# Patient Record
Sex: Male | Born: 1975 | Race: Black or African American | Hispanic: No | Marital: Single | Smoking: Current every day smoker
Health system: Southern US, Community
[De-identification: ages and names within clinical notes are randomized; demographics above are authoritative.]

---

## 2016-04-23 ENCOUNTER — Emergency Department
Admission: EM | Admit: 2016-04-23 | Discharge: 2016-04-23 | Disposition: A | Discharge status: Home / Self Care | Payer: Self-pay | Attending: Emergency Medicine | Admitting: Emergency Medicine

## 2016-04-23 ENCOUNTER — Encounter: Payer: Self-pay | Admitting: *Deleted

## 2016-04-23 ENCOUNTER — Emergency Department: Payer: Self-pay

## 2016-04-23 DIAGNOSIS — R079 Chest pain, unspecified: Secondary | ICD-10-CM | POA: Insufficient documentation

## 2016-04-23 DIAGNOSIS — R51 Headache: Secondary | ICD-10-CM | POA: Insufficient documentation

## 2016-04-23 DIAGNOSIS — F17210 Nicotine dependence, cigarettes, uncomplicated: Secondary | ICD-10-CM | POA: Insufficient documentation

## 2016-04-23 LAB — CBC
HEMATOCRIT: 41.4 % (ref 40.0–52.0)
Hemoglobin: 14.2 g/dL (ref 13.0–18.0)
MCH: 31.4 pg (ref 26.0–34.0)
MCHC: 34.3 g/dL (ref 32.0–36.0)
MCV: 91.5 fL (ref 80.0–100.0)
Platelets: 228 10*3/uL (ref 150–440)
RBC: 4.52 MIL/uL (ref 4.40–5.90)
RDW: 13.6 % (ref 11.5–14.5)
WBC: 6.1 10*3/uL (ref 3.8–10.6)

## 2016-04-23 LAB — BASIC METABOLIC PANEL
Anion gap: 9 (ref 5–15)
BUN: 13 mg/dL (ref 6–20)
CALCIUM: 9.2 mg/dL (ref 8.9–10.3)
CO2: 26 mmol/L (ref 22–32)
Chloride: 103 mmol/L (ref 101–111)
Creatinine, Ser: 1.09 mg/dL (ref 0.61–1.24)
GFR calc Af Amer: >60 mL/min (ref >60)
GLUCOSE: 84 mg/dL (ref 65–99)
Potassium: 3.7 mmol/L (ref 3.5–5.1)
Sodium: 138 mmol/L (ref 135–145)

## 2016-04-23 LAB — FIBRIN DERIVATIVES D-DIMER (ARMC ONLY): FIBRIN DERIVATIVES D-DIMER (ARMC): 226 (ref 0–499)

## 2016-04-23 LAB — TROPONIN I
Troponin I: <0.03 ng/mL (ref <0.03)
Troponin I: <0.03 ng/mL (ref <0.03)

## 2016-04-23 MED ORDER — ACETAMINOPHEN 325 MG PO TABS
ORAL_TABLET | ORAL | Status: AC
  Filled 2016-04-23: qty 2

## 2016-04-23 MED ORDER — ACETAMINOPHEN 325 MG PO TABS
650.0000 mg | ORAL_TABLET | Freq: Once | ORAL | Status: AC
  Administered 2016-04-23: 650 mg via ORAL

## 2016-04-23 MED ORDER — GI COCKTAIL ~~LOC~~
30.0000 mL | Freq: Once | ORAL | Status: AC
  Administered 2016-04-23: 30 mL via ORAL
  Filled 2016-04-23: qty 30

## 2016-04-23 MED ORDER — NITROGLYCERIN 0.4 MG SL SUBL
0.4000 mg | SUBLINGUAL_TABLET | SUBLINGUAL | Status: DC | PRN
  Administered 2016-04-23: 0.4 mg via SUBLINGUAL
  Filled 2016-04-23: qty 1

## 2016-04-23 NOTE — ED Notes (Signed)
Report off to iris rn  

## 2016-04-23 NOTE — ED Notes (Signed)
Pt discharged to home.  Discharge instructions reviewed.  Verbalized understanding.  No questions or concerns at this time.  Teach back verified.  Pt in NAD.  No items left in ED.   

## 2016-04-23 NOTE — ED Provider Notes (Signed)
[facility] Emergency Department Provider Note   ____________________________________________   First MD Initiated Contact with Patient 04/23/16 [phone]     (approximate)  I have reviewed the triage vital signs and the nursing notes.   HISTORY  Chief Complaint Chest Pain    HPI Victor Adams is a [age] y.o. male who comes into the hospital today with chest pain. She reports that having left-sided chest pain that started a little while ago. He reports though that for the last couple of weeks it's been more frequent. It was hurting really bad today so he decided to come in and get checked out. He reports that the pain is sharp. He denies any shortness of breath and vomiting but he has had some nausea. The patient has had some headache but denies dizziness or lightheadedness. The pain does not radiate but is a little bit worse with inspiration. He has not been taking anything for pain and rates his pain a 7-8 out of 10 in intensity. The patient is here today for evaluation of the symptoms.   History reviewed. No pertinent past medical history.  There are no active problems to display for this patient.   History reviewed. No pertinent surgical history.  Prior to Admission medications   Not on File    Allergies Patient has no known allergies.  No family history on file.  Social History Social History  Substance Use Topics  . Smoking status: Current Every Day Smoker  . Smokeless tobacco: Never Used  . Alcohol use No    Review of Systems Constitutional: No fever/chills Eyes: No visual changes. ENT: No sore throat. Cardiovascular:  chest pain. Respiratory: Denies shortness of breath. Gastrointestinal: Nausea with No abdominal pain. no vomiting.  No diarrhea.  No constipation. Genitourinary: Negative for dysuria. Musculoskeletal: Negative for back pain. Skin: Negative for rash. Neurological: Negative for headaches, focal weakness or  numbness.  10-point ROS otherwise negative.  ____________________________________________   PHYSICAL EXAM:  VITAL SIGNS: ED Triage Vitals  Enc Vitals Group     BP 04/23/16 0043 (!) 151/103     Pulse Rate 04/23/16 0043 70     Resp 04/23/16 0043 20     Temp 04/23/16 0043 98.4 F (36.9 C)     Temp Source 04/23/16 0043 Oral     SpO2 04/23/16 0043 99 %     Weight 04/23/16 0043 245 lb (111.1 kg)     Height 04/23/16 0043 5\' 11"  (1.803 m)     Head Circumference --      Peak Flow --      Pain Score 04/23/16 0044 6     Pain Loc --      Pain Edu? --      Excl. in GC? --     Constitutional: Alert and oriented. Well appearing and in Mild distress. Eyes: Conjunctivae are normal. PERRL. EOMI. Head: Atraumatic. Nose: No congestion/rhinnorhea. Mouth/Throat: Mucous membranes are moist.  Oropharynx non-erythematous. Cardiovascular: Normal rate, regular rhythm. Grossly normal heart sounds.  Good peripheral circulation. Respiratory: Normal respiratory effort.  No retractions. Lungs CTAB. Gastrointestinal: Soft and nontender. No distention. Positive bowel sounds Musculoskeletal: No lower extremity tenderness nor edema.  No joint effusions. Neurologic:  Normal speech and language.  Skin:  Skin is warm, dry and intact.  Psychiatric: Mood and affect are normal.   ____________________________________________   LABS (all labs ordered are listed, but only abnormal results are displayed)  Labs Reviewed  BASIC METABOLIC PANEL  CBC  TROPONIN I  FIBRIN DERIVATIVES D-DIMER [facility] ONLY)  TROPONIN I   ____________________________________________  EKG  ED ECG REPORT I, Rebecka Apley, the attending physician, personally viewed and interpreted this ECG.   Date: 04/23/2016  EKG Time: 0041  Rate: 67  Rhythm: normal sinus rhythm  Axis: normal  Intervals:none  ST&T Change:  none  ____________________________________________  RADIOLOGY  CXR ____________________________________________   PROCEDURES  Procedure(s) performed: None  Procedures  Critical Care performed: No  ____________________________________________   INITIAL IMPRESSION / ASSESSMENT AND PLAN / ED COURSE  Pertinent labs & imaging results that were available during my care of the patient were reviewed by me and considered in my medical decision making (see chart for details).  This is a 41 year old male who comes into the hospital with chest pain. He has been having this pain on and off for multiple weeks. I did check some blood work to include a d-dimer and 2 cardiac enzymes and they were negative. I did give the patient a GI cocktail and some nitroglycerin and the pain is improved. The patient will be discharged to home to follow-up with cardiology as well as the acute care clinic. The patient has no further questions or concerns at this time.  Clinical Course as of Apr 23 509  Tue Apr 23, 2016  0149 No active cardiopulmonary disease. DG Chest 2 View [AW]    Clinical Course User Index [AW] Rebecka Apley, MD     ____________________________________________   FINAL CLINICAL IMPRESSION(S) / ED DIAGNOSES  Final diagnoses:  Chest pain, unspecified type      NEW MEDICATIONS STARTED DURING THIS VISIT:  There are no discharge medications for this patient.    Note:  This document was prepared using Dragon voice recognition software and may include unintentional dictation errors.    Rebecka Apley, MD 04/23/16 [phone]

## 2016-04-23 NOTE — ED Triage Notes (Signed)
Pt brought in via ems from [facility].  Pt has left side chest pain   No sob.  Pt alert.  Iv in place.  Ems gave 324 asa

## 2016-04-23 NOTE — ED Notes (Signed)
Pt has left side chest pain.  Intermittent pain for several weeks. Pt now states sob with exertion.  cig smoker.  Dry cough.  No fever.  nonradiating pain in chest.  No n/v/  No diaphoresis.  Pt alert.  Iv in place

## 2016-04-23 NOTE — ED Notes (Signed)
Pt has a headache.  meds given.  Pt states chest pain improved.

## 2017-07-27 IMAGING — CR DG CHEST 2V
1 series · 2 of 2 positions shown · non-contrast
Comparison: None.

CLINICAL DATA: Left-sided chest pain intermittently for several
weeks. Now shortness of breath on exertion. Current smoker.

EXAM:
CHEST  2 VIEW

[Series 1: dg chest 2 view · 0.14mm/px · 2 of 2 slices shown]
[im 1/2]
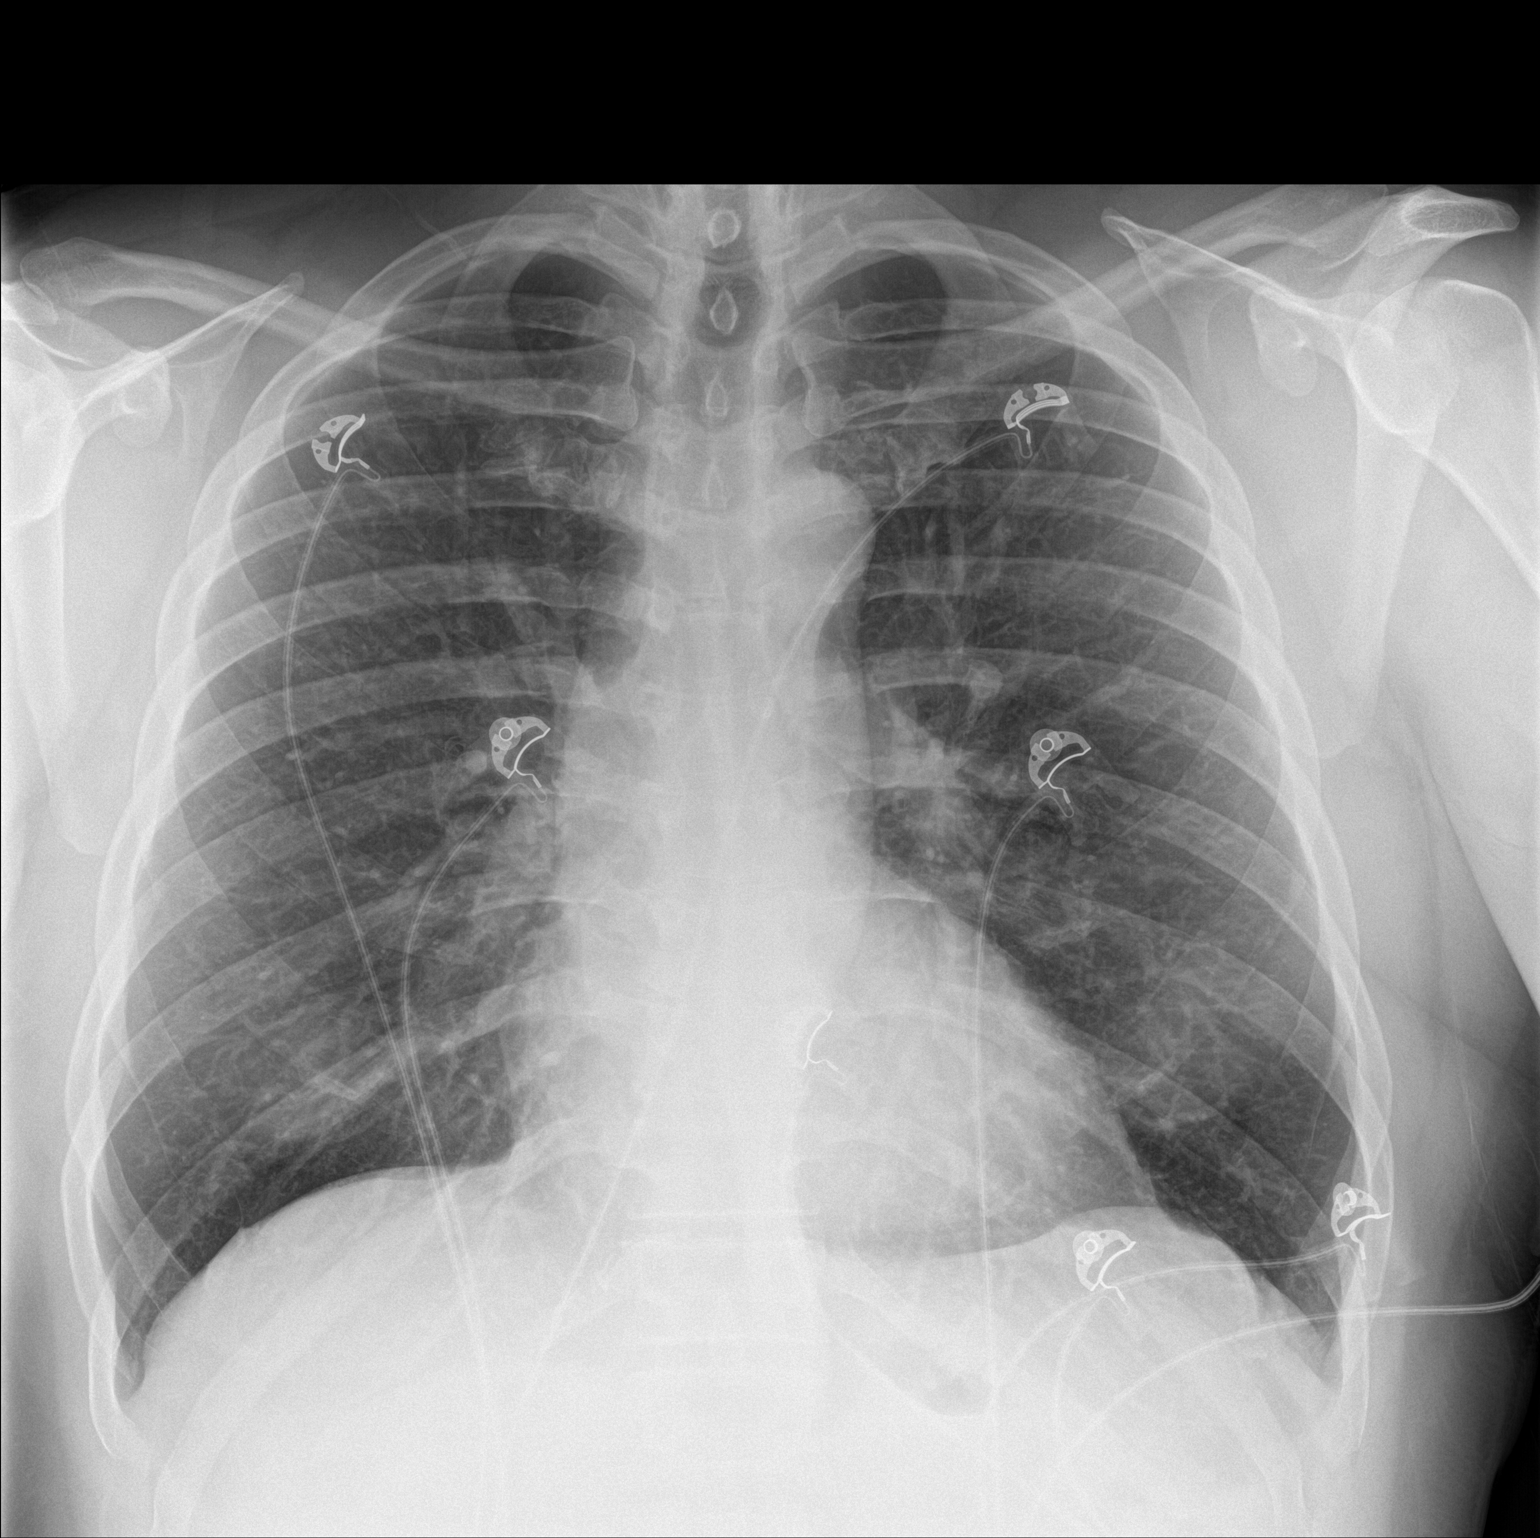
[im 2/2]
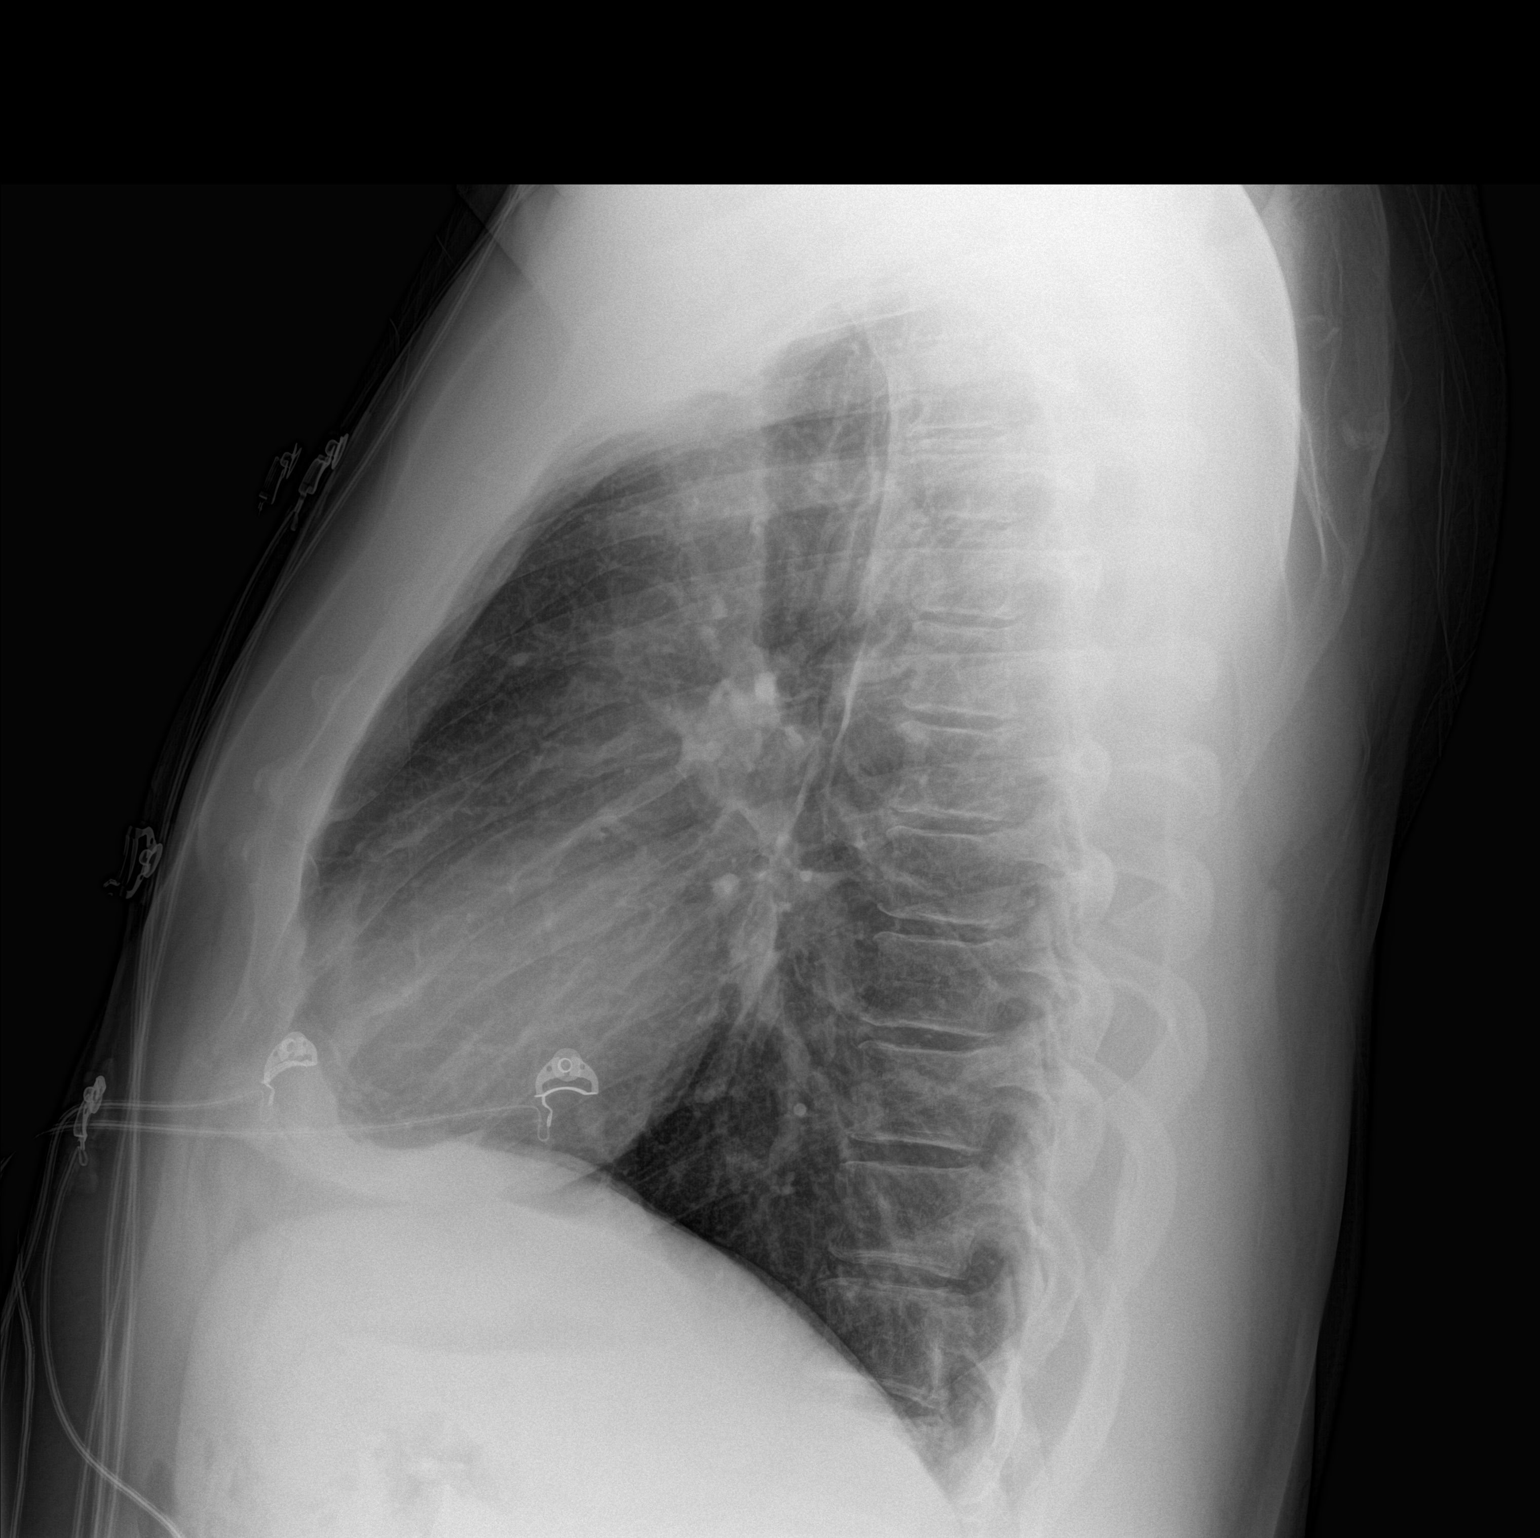

[2 of 2 positions shown; findings below may reference images not displayed]

FINDINGS: Normal heart size and pulmonary vascularity. No focal airspace
disease or consolidation in the lungs. No blunting of costophrenic
angles. No pneumothorax. Mediastinal contours appear intact.
Tortuous aorta.
IMPRESSION: No active cardiopulmonary disease.
